# Patient Record
Sex: Female | Born: 1990 | Race: White | Hispanic: No | Marital: Single | Smoking: Current some day smoker
Health system: Southern US, Community
[De-identification: ages and names within clinical notes are randomized; demographics above are authoritative.]

## PROBLEM LIST (undated history)

## (undated) DIAGNOSIS — J189 Pneumonia, unspecified organism: Secondary | ICD-10-CM

## (undated) DIAGNOSIS — I10 Essential (primary) hypertension: Secondary | ICD-10-CM

## (undated) DIAGNOSIS — J45909 Unspecified asthma, uncomplicated: Secondary | ICD-10-CM

## (undated) DIAGNOSIS — D66 Hereditary factor VIII deficiency: Secondary | ICD-10-CM

## (undated) DIAGNOSIS — E162 Hypoglycemia, unspecified: Secondary | ICD-10-CM

---

## 2008-01-06 ENCOUNTER — Ambulatory Visit: Payer: Self-pay | Admitting: Family Medicine

## 2008-03-08 ENCOUNTER — Ambulatory Visit: Payer: Self-pay | Admitting: Family Medicine

## 2008-07-18 ENCOUNTER — Other Ambulatory Visit: Admission: RE | Admit: 2008-07-18 | Discharge: 2008-07-18 | Payer: Self-pay | Admitting: Family Medicine

## 2008-07-18 ENCOUNTER — Ambulatory Visit: Payer: Self-pay | Admitting: Family Medicine

## 2008-10-11 ENCOUNTER — Ambulatory Visit: Payer: Self-pay | Admitting: Family Medicine

## 2009-04-05 ENCOUNTER — Ambulatory Visit: Payer: Self-pay | Admitting: Family Medicine

## 2009-11-29 ENCOUNTER — Emergency Department (HOSPITAL_BASED_OUTPATIENT_CLINIC_OR_DEPARTMENT_OTHER): Admission: EM | Admit: 2009-11-29 | Discharge: 2009-11-29 | Payer: Self-pay | Admitting: Emergency Medicine

## 2009-11-29 ENCOUNTER — Ambulatory Visit: Payer: Self-pay | Admitting: Diagnostic Radiology

## 2012-04-01 ENCOUNTER — Encounter (HOSPITAL_COMMUNITY): Payer: Self-pay | Admitting: Emergency Medicine

## 2012-04-01 ENCOUNTER — Emergency Department (HOSPITAL_COMMUNITY)
Admission: EM | Admit: 2012-04-01 | Discharge: 2012-04-01 | Disposition: A | Discharge status: Home / Self Care | Payer: Federal, State, Local not specified - PPO | Attending: Emergency Medicine | Admitting: Emergency Medicine

## 2012-04-01 DIAGNOSIS — R6883 Chills (without fever): Secondary | ICD-10-CM | POA: Insufficient documentation

## 2012-04-01 DIAGNOSIS — L818 Other specified disorders of pigmentation: Secondary | ICD-10-CM

## 2012-04-01 DIAGNOSIS — J45909 Unspecified asthma, uncomplicated: Secondary | ICD-10-CM | POA: Insufficient documentation

## 2012-04-01 DIAGNOSIS — L02419 Cutaneous abscess of limb, unspecified: Secondary | ICD-10-CM | POA: Insufficient documentation

## 2012-04-01 DIAGNOSIS — L299 Pruritus, unspecified: Secondary | ICD-10-CM | POA: Insufficient documentation

## 2012-04-01 DIAGNOSIS — IMO0002 Reserved for concepts with insufficient information to code with codable children: Secondary | ICD-10-CM | POA: Insufficient documentation

## 2012-04-01 DIAGNOSIS — M79609 Pain in unspecified limb: Secondary | ICD-10-CM | POA: Insufficient documentation

## 2012-04-01 DIAGNOSIS — T4995XA Adverse effect of unspecified topical agent, initial encounter: Secondary | ICD-10-CM | POA: Insufficient documentation

## 2012-04-01 DIAGNOSIS — Z862 Personal history of diseases of the blood and blood-forming organs and certain disorders involving the immune mechanism: Secondary | ICD-10-CM | POA: Insufficient documentation

## 2012-04-01 DIAGNOSIS — R21 Rash and other nonspecific skin eruption: Secondary | ICD-10-CM | POA: Insufficient documentation

## 2012-04-01 DIAGNOSIS — Z8701 Personal history of pneumonia (recurrent): Secondary | ICD-10-CM | POA: Insufficient documentation

## 2012-04-01 DIAGNOSIS — L0390 Cellulitis, unspecified: Secondary | ICD-10-CM

## 2012-04-01 DIAGNOSIS — Z79899 Other long term (current) drug therapy: Secondary | ICD-10-CM | POA: Insufficient documentation

## 2012-04-01 DIAGNOSIS — I10 Essential (primary) hypertension: Secondary | ICD-10-CM | POA: Insufficient documentation

## 2012-04-01 DIAGNOSIS — L03119 Cellulitis of unspecified part of limb: Secondary | ICD-10-CM | POA: Insufficient documentation

## 2012-04-01 DIAGNOSIS — Z8639 Personal history of other endocrine, nutritional and metabolic disease: Secondary | ICD-10-CM | POA: Insufficient documentation

## 2012-04-01 HISTORY — DX: Essential (primary) hypertension: I10

## 2012-04-01 HISTORY — DX: Hereditary factor VIII deficiency: D66

## 2012-04-01 HISTORY — DX: Pneumonia, unspecified organism: J18.9

## 2012-04-01 HISTORY — DX: Unspecified asthma, uncomplicated: J45.909

## 2012-04-01 HISTORY — DX: Hypoglycemia, unspecified: E16.2

## 2012-04-01 MED ORDER — SULFAMETHOXAZOLE-TRIMETHOPRIM 800-160 MG PO TABS: 2.0000 | ORAL_TABLET | Freq: Two times a day (BID) | ORAL | Status: DC

## 2012-04-01 MED ORDER — NAPROXEN 500 MG PO TABS: 500.0000 mg | ORAL_TABLET | Freq: Two times a day (BID) | ORAL | Status: DC

## 2012-04-01 NOTE — ED Provider Notes (Signed)
History     CSN: [id number]  Arrival date & time 04/01/12  1818   First MD Initiated Contact with Patient 04/01/12 1834      No chief complaint on file.   (Consider location/radiation/quality/duration/timing/severity/associated sxs/prior treatment) HPI Comments: 22 year old female presents with CC "chunks of skin falling off", new redness, and pruritus at new tattoo site x 24 hours. Tattoo was originally done 5 days ago and redness and puritis has been worsening. It is on laterally aspect of right lower leg. Pt states her sister was treated at the ER yesterday for a "staph infection" at the tattoo site following their joint visit to the same tattoo parlor. She is now concerned she has a similar infection. Reports having chills x 24 hours. Pt tried vasoline on tattoo but sxs did not improve. Denies: SOB, cough, nausea, vomiting, numbness/tingling in extremities, swelling to the affected leg. Denies being a DM, HIV, steroid use or other immunocompromised hx.   The history is provided by the patient.    No past medical history on file.  No past surgical history on file.  No family history on file.  History  Substance Use Topics  . Smoking status: Not on file  . Smokeless tobacco: Not on file  . Alcohol Use: Not on file    OB History   No data available      Review of Systems  Constitutional: Positive for chills.  Respiratory: Negative for cough and shortness of breath.   Gastrointestinal: Negative for nausea and vomiting.  Skin: Positive for rash.  Neurological: Negative for numbness.  All other systems reviewed and are negative.    Allergies  Amoxicillin and Penicillins  Home Medications   Current Outpatient Rx  Name  Route  Sig  Dispense  Refill  . acetaminophen (TYLENOL) 500 MG tablet   Oral   Take 1,000 mg by mouth every 6 (six) hours as needed for pain.         Marland Kitchen albuterol (PROVENTIL HFA;VENTOLIN HFA) 108 (90 BASE) MCG/ACT inhaler   Inhalation   Inhale 2  puffs into the lungs every 6 (six) hours as needed for wheezing.         . fluticasone (FLONASE) 50 MCG/ACT nasal spray   Nasal   Place 2 sprays into the nose daily as needed for allergies.         Marland Kitchen ibuprofen (ADVIL,MOTRIN) 200 MG tablet   Oral   Take 800 mg by mouth every 8 (eight) hours as needed for pain.           There were no vitals taken for this visit.  Physical Exam  Nursing note and vitals reviewed. Constitutional: She is oriented to person, place, and time. She appears well-developed and well-nourished. No distress.  HENT:  Head: Normocephalic and atraumatic.  Eyes: Conjunctivae and EOM are normal.  Neck: Normal range of motion.  Cardiovascular: Normal rate, regular rhythm and normal heart sounds.   Pulmonary/Chest: Effort normal and breath sounds normal. She has no wheezes. She has no rales.  Musculoskeletal: Normal range of motion.  Neurological: She is alert and oriented to person, place, and time.  Skin: Skin is warm and dry. Rash noted. She is not diaphoretic.     Tattoo on lateral aspect of right lower leg appears erythematous around ink. No edema or excessive warmth appreciated. Tender to palpation.   Psychiatric: She has a normal mood and affect. Her behavior is normal.    ED Course  Procedures (  including critical care time)  Labs Reviewed - No data to display No results found.   No diagnosis found.    MDM  Possible cellulitic infection surrounding  Patient is a 23 year old female who reports worsening pain, erythema and pruritus site of tattoo of performed 5 days ago. Patient will be treated for MRSA infection and advised to return for wound check in 2 days or sooner if infection worsens. No concern for systemic infection as patient is afebrile with out streaking up leg, pain into joint or other concerning s/s. Strict return precautions discussed.         Jaci Carrel, [place] 04/01/12 1954

## 2012-04-01 NOTE — ED Provider Notes (Signed)
Medical screening examination/treatment/procedure(s) were performed by non-physician practitioner and as supervising physician I was immediately available for consultation/collaboration.    Celene Kras, MD 04/01/12 2003

## 2012-04-01 NOTE — ED Notes (Signed)
Patient received tatoo this past Saturday.  Noticed redness starting two days ago, drainage a yellow color.

## 2012-04-23 ENCOUNTER — Emergency Department (HOSPITAL_COMMUNITY)
Admission: EM | Admit: 2012-04-23 | Discharge: 2012-04-23 | Disposition: A | Discharge status: Home / Self Care | Payer: Federal, State, Local not specified - PPO | Attending: Emergency Medicine | Admitting: Emergency Medicine

## 2012-04-23 ENCOUNTER — Emergency Department (HOSPITAL_COMMUNITY): Payer: Federal, State, Local not specified - PPO

## 2012-04-23 ENCOUNTER — Encounter (HOSPITAL_COMMUNITY): Payer: Self-pay | Admitting: Emergency Medicine

## 2012-04-23 DIAGNOSIS — J45909 Unspecified asthma, uncomplicated: Secondary | ICD-10-CM | POA: Insufficient documentation

## 2012-04-23 DIAGNOSIS — Z862 Personal history of diseases of the blood and blood-forming organs and certain disorders involving the immune mechanism: Secondary | ICD-10-CM | POA: Insufficient documentation

## 2012-04-23 DIAGNOSIS — Z3202 Encounter for pregnancy test, result negative: Secondary | ICD-10-CM | POA: Insufficient documentation

## 2012-04-23 DIAGNOSIS — Z79899 Other long term (current) drug therapy: Secondary | ICD-10-CM | POA: Insufficient documentation

## 2012-04-23 DIAGNOSIS — I10 Essential (primary) hypertension: Secondary | ICD-10-CM | POA: Insufficient documentation

## 2012-04-23 DIAGNOSIS — Z8701 Personal history of pneumonia (recurrent): Secondary | ICD-10-CM | POA: Insufficient documentation

## 2012-04-23 DIAGNOSIS — R109 Unspecified abdominal pain: Secondary | ICD-10-CM

## 2012-04-23 LAB — URINALYSIS, ROUTINE W REFLEX MICROSCOPIC
Bilirubin Urine: NEGATIVE
Glucose, UA: NEGATIVE mg/dL
Hgb urine dipstick: NEGATIVE
Ketones, ur: NEGATIVE mg/dL
Nitrite: NEGATIVE
Protein, ur: NEGATIVE mg/dL
Specific Gravity, Urine: 1.02 (ref 1.005–1.030)
Urobilinogen, UA: 0.2 mg/dL (ref 0.0–1.0)
pH: 7.5 (ref 5.0–8.0)

## 2012-04-23 LAB — POCT I-STAT, CHEM 8
BUN: 13 mg/dL (ref 6–23)
Calcium, Ion: 1.18 mmol/L (ref 1.12–1.23)
Chloride: 103 mEq/L (ref 96–112)
Creatinine, Ser: 0.8 mg/dL (ref 0.50–1.10)
Glucose, Bld: 86 mg/dL (ref 70–99)
HCT: 38 % (ref 36.0–46.0)
Hemoglobin: 12.9 g/dL (ref 12.0–15.0)
Potassium: 4 mEq/L (ref 3.5–5.1)
Sodium: 140 mEq/L (ref 135–145)
TCO2: 28 mmol/L (ref 0–100)

## 2012-04-23 LAB — URINE MICROSCOPIC-ADD ON

## 2012-04-23 LAB — PREGNANCY, URINE: Preg Test, Ur: NEGATIVE

## 2012-04-23 NOTE — ED Notes (Signed)
Patient saw blood in stool twice today small amount.  No history of hemorrhoids.

## 2012-04-23 NOTE — ED Notes (Signed)
Patient c/o left flank pain since Monday.  Patient also reports bright red blood in stool.  Patient denies fevers or N/V/D.

## 2012-04-23 NOTE — ED Provider Notes (Signed)
History    22 year old female with left flank pain. Onset about 3 days ago. Patient first noticed that she was walking up some steps. He has been intermittent since then. Sharp. Radiates to her back. No urinary complaints. No unusual vaginal bleeding or discharge. No fevers or chills. No nausea or vomiting. No diarrhea. No history of similar pain. No significant surgical history.  CSN: [id number]  Arrival date & time 04/23/12  9562   First MD Initiated Contact with Patient 04/23/12 1845      Chief Complaint  Patient presents with  . Flank Pain    (Consider location/radiation/quality/duration/timing/severity/associated sxs/prior treatment) HPI  Past Medical History  Diagnosis Date  . Pneumonia   . Hypertension   . Asthma   . Hemophilia   . Hypoglycemia     History reviewed. No pertinent past surgical history.  History reviewed. No pertinent family history.  History  Substance Use Topics  . Smoking status: Not on file  . Smokeless tobacco: Not on file  . Alcohol Use: Not on file    OB History   Grav Para Term Preterm Abortions TAB SAB Ect Mult Living                  Review of Systems  Allergies  Amoxicillin and Penicillins  Home Medications   Current Outpatient Rx  Name  Route  Sig  Dispense  Refill  . albuterol (PROVENTIL HFA;VENTOLIN HFA) 108 (90 BASE) MCG/ACT inhaler   Inhalation   Inhale 2 puffs into the lungs every 6 (six) hours as needed for wheezing.         . fluticasone (FLONASE) 50 MCG/ACT nasal spray   Nasal   Place 2 sprays into the nose daily as needed for allergies.         Marland Kitchen ibuprofen (ADVIL,MOTRIN) 200 MG tablet   Oral   Take 800 mg by mouth every 8 (eight) hours as needed for pain.         . naproxen (NAPROSYN) 500 MG tablet   Oral   Take 1 tablet (500 mg total) by mouth 2 (two) times daily.   30 tablet   0     BP 132/93  Pulse 78  Temp(Src) 98 F (36.7 C) (Oral)  Resp 20  SpO2 100%  LMP 03/23/2012  Physical Exam   Nursing note and vitals reviewed. Constitutional: She appears well-developed and well-nourished. No distress.  HENT:  Head: Normocephalic and atraumatic.  Eyes: Conjunctivae are normal. Right eye exhibits no discharge. Left eye exhibits no discharge.  Neck: Neck supple.  Cardiovascular: Normal rate, regular rhythm and normal heart sounds.  Exam reveals no gallop and no friction rub.   No murmur heard. Pulmonary/Chest: Effort normal and breath sounds normal. No respiratory distress.  Abdominal: Soft. She exhibits no distension and no mass. There is tenderness. There is no rebound and no guarding.  Left-sided abdominal tenderness without rebound or guarding. No distention.  Genitourinary:  No costovertebral angle tenderness  Musculoskeletal: She exhibits no edema and no tenderness.  Neurological: She is alert.  Skin: Skin is warm and dry. She is not diaphoretic.  Psychiatric: She has a normal mood and affect. Her behavior is normal. Thought content normal.    ED Course  Procedures (including critical care time)  Labs Reviewed  URINALYSIS, ROUTINE W REFLEX MICROSCOPIC - Abnormal; Notable for the following:    APPearance CLOUDY (*)    Leukocytes, UA MODERATE (*)    All other components within normal limits  URINE MICROSCOPIC-ADD ON - Abnormal; Notable for the following:    Squamous Epithelial / LPF MANY (*)    All other components within normal limits  PREGNANCY, URINE  POCT I-STAT, CHEM 8   Ct Abdomen Pelvis Wo Contrast  04/23/2012  *RADIOLOGY REPORT*  Clinical Data: Left flank pain for 4 days, bright red blood in stool  CT ABDOMEN AND PELVIS WITHOUT CONTRAST  Technique:  Multidetector CT imaging of the abdomen and pelvis was performed following the standard protocol without intravenous contrast. Sagittal and coronal MPR images reconstructed from axial data set.  Comparison: None  Findings: Lung bases clear. No urinary tract calcification, hydronephrosis or ureteral dilatation.  Within limits of a nonenhanced exam no focal abnormalities of the liver, spleen, pancreas, kidneys, or adrenal glands identified. Normal appendix.  Unremarkable bladder, ureters, uterus and adnexae. Stomach and bowel loops grossly normal for technique. No mass, adenopathy, free fluid or inflammatory process. No hernia or acute bony lesion.  IMPRESSION: No acute intra-abdominal or intrapelvic abnormalities.   Original Report Authenticated By: Ulyses Southward, M.D.      1. Flank pain       MDM  22yf with L flank pain. W/u including CT a/p reassuring.  Possibly muscular strain? Low suspicion for emergent etiology. Symptomatic tx. Return precautions discussed. Outpt FU otherwise.         Raeford Razor, MD 04/25/12 [phone]

## 2012-07-17 ENCOUNTER — Emergency Department (HOSPITAL_COMMUNITY): Payer: Federal, State, Local not specified - PPO

## 2012-07-17 ENCOUNTER — Emergency Department (HOSPITAL_COMMUNITY)
Admission: EM | Admit: 2012-07-17 | Discharge: 2012-07-17 | Disposition: A | Discharge status: Home / Self Care | Payer: Federal, State, Local not specified - PPO | Attending: Emergency Medicine | Admitting: Emergency Medicine

## 2012-07-17 DIAGNOSIS — Z8639 Personal history of other endocrine, nutritional and metabolic disease: Secondary | ICD-10-CM | POA: Insufficient documentation

## 2012-07-17 DIAGNOSIS — S8990XA Unspecified injury of unspecified lower leg, initial encounter: Secondary | ICD-10-CM | POA: Insufficient documentation

## 2012-07-17 DIAGNOSIS — Z8701 Personal history of pneumonia (recurrent): Secondary | ICD-10-CM | POA: Insufficient documentation

## 2012-07-17 DIAGNOSIS — Z791 Long term (current) use of non-steroidal anti-inflammatories (NSAID): Secondary | ICD-10-CM | POA: Insufficient documentation

## 2012-07-17 DIAGNOSIS — Y9389 Activity, other specified: Secondary | ICD-10-CM | POA: Insufficient documentation

## 2012-07-17 DIAGNOSIS — Y9289 Other specified places as the place of occurrence of the external cause: Secondary | ICD-10-CM | POA: Insufficient documentation

## 2012-07-17 DIAGNOSIS — M25572 Pain in left ankle and joints of left foot: Secondary | ICD-10-CM

## 2012-07-17 DIAGNOSIS — S99929A Unspecified injury of unspecified foot, initial encounter: Secondary | ICD-10-CM | POA: Insufficient documentation

## 2012-07-17 DIAGNOSIS — Z79899 Other long term (current) drug therapy: Secondary | ICD-10-CM | POA: Insufficient documentation

## 2012-07-17 DIAGNOSIS — Z862 Personal history of diseases of the blood and blood-forming organs and certain disorders involving the immune mechanism: Secondary | ICD-10-CM | POA: Insufficient documentation

## 2012-07-17 DIAGNOSIS — S9000XA Contusion of unspecified ankle, initial encounter: Secondary | ICD-10-CM | POA: Insufficient documentation

## 2012-07-17 DIAGNOSIS — Z880 Allergy status to penicillin: Secondary | ICD-10-CM | POA: Insufficient documentation

## 2012-07-17 DIAGNOSIS — Y990 Civilian activity done for income or pay: Secondary | ICD-10-CM | POA: Insufficient documentation

## 2012-07-17 DIAGNOSIS — X500XXA Overexertion from strenuous movement or load, initial encounter: Secondary | ICD-10-CM | POA: Insufficient documentation

## 2012-07-17 DIAGNOSIS — J45909 Unspecified asthma, uncomplicated: Secondary | ICD-10-CM | POA: Insufficient documentation

## 2012-07-17 DIAGNOSIS — I10 Essential (primary) hypertension: Secondary | ICD-10-CM | POA: Insufficient documentation

## 2012-07-17 MED ORDER — IBUPROFEN 800 MG PO TABS
800.0000 mg | ORAL_TABLET | Freq: Once | ORAL | Status: AC
  Administered 2012-07-17: 800 mg via ORAL
  Filled 2012-07-17: qty 1

## 2012-07-17 NOTE — ED Provider Notes (Signed)
History    This chart was scribed for Debra Velez, non-physician practitioner working with Raeford Razor, MD by Leone Payor, ED Scribe. This patient was seen in room WTR9/WTR9 and the patient's care was started at 2145.   CSN: [id number]  Arrival date & time 07/17/12  2145   First MD Initiated Contact with Patient 07/17/12 2226      Chief Complaint  Patient presents with  . Ankle Injury     The history is provided by the patient. No language interpreter was used.    HPI Comments: Debra Velez is a 22 y.o. female who presents to the Emergency Department complaining of a L ankle injury starting this morning. States she rolled her ankle and now has swelling and pain to lateral side of L ankle. 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 When she got off work she noticed some swelling and bruising. She did not use any ice PTA.  Pt is able to bear weight and walk. Pt did drive herself here.    Past Medical History  Diagnosis Date  . Pneumonia   . Hypertension   . Asthma   . Hemophilia   . Hypoglycemia     No past surgical history on file.  No family history on file.  History  Substance Use Topics  . Smoking status: Not on file  . Smokeless tobacco: Not on file  . Alcohol Use: Not on file    OB History   Grav Para Term Preterm Abortions TAB SAB Ect Mult Living                  Review of Systems  Constitutional: Negative for fever and diaphoresis.  HENT: Negative for neck pain and neck stiffness.   Eyes: Negative for visual disturbance.  Respiratory: Negative for apnea, chest tightness and shortness of breath.   Cardiovascular: Negative for chest pain and palpitations.  Gastrointestinal: Negative for nausea, vomiting, diarrhea and constipation.  Musculoskeletal: Positive for joint swelling. Negative for gait problem.       Left lateral ankle  Skin: Negative for rash.   Neurological: Negative for dizziness, weakness, light-headedness, numbness and headaches.  Psychiatric/Behavioral: Negative for behavioral problems.    Allergies  Amoxicillin and Penicillins  Home Medications   Current Outpatient Rx  Name  Route  Sig  Dispense  Refill  . ibuprofen (ADVIL,MOTRIN) 200 MG tablet   Oral   Take 800 mg by mouth every 8 (eight) hours as needed for pain.         . naproxen (NAPROSYN) 500 MG tablet   Oral   Take 1 tablet (500 mg total) by mouth 2 (two) times daily.   30 tablet   0   . albuterol (PROVENTIL HFA;VENTOLIN HFA) 108 (90 BASE) MCG/ACT inhaler   Inhalation   Inhale 2 puffs into the lungs every 6 (six) hours as needed for wheezing.         . fluticasone (FLONASE) 50 MCG/ACT nasal spray   Nasal   Place 2 sprays into the nose daily as needed for allergies.           BP 131/86  Pulse 82  Temp(Src) 98.9 F (37.2 C) (Oral)  Resp 16  SpO2 100%  LMP 07/16/2012  Physical Exam  Nursing note and vitals reviewed. Constitutional: She is oriented to person, place, and time. She appears well-developed and well-nourished. No distress.  HENT:  Head: Normocephalic and atraumatic.  Eyes: Conjunctivae and EOM are normal.  Neck: Normal range of motion. Neck supple.  No meningeal signs  Cardiovascular: Normal rate, regular rhythm, normal heart sounds and intact distal pulses.   Good pedal pulses.   Pulmonary/Chest: Effort normal and breath sounds normal.  Abdominal: Soft.  Musculoskeletal: Normal range of motion. She exhibits no edema and no tenderness.  FROM to injured extremity. Able to wiggle toes. Good plantar and dorsiflexion.   Neurological: She is alert and oriented to person, place, and time. No cranial nerve deficit.  Speech is clear and goal oriented, follows commands Sensation normal to light touch Moves extremities without ataxia, coordination intact Normal gait and balance Normal strength in upper and lower extremities  bilaterally including dorsiflexion and plantar flexion, strong and equal grip strength   Skin: Skin is warm and dry. She is not diaphoretic. No erythema.  Psychiatric: She has a normal mood and affect.    ED Course  Procedures (including critical care time)  DIAGNOSTIC STUDIES: Oxygen Saturation is 100% on RA, normal by my interpretation.    COORDINATION OF CARE: 11:09 PM Discussed treatment plan with pt at bedside and pt agreed to plan.   Labs Reviewed - No data to display Dg Ankle Complete Left  07/17/2012   *RADIOLOGY REPORT*  Clinical Data: Twist injury today, lateral pain and swelling  LEFT ANKLE COMPLETE - 3+ VIEW  Comparison: None  Findings: Osseous mineralization normal. Joint spaces preserved. No acute fracture, dislocation or bone destruction.  IMPRESSION: No acute osseous abnormalities.   Original Report Authenticated By: Ulyses Southward, M.D.     1. Left lateral ankle pain       MDM  FROM. Neurovascularly intact. Imaging shows no fracture. Directed pt to ice injury, take acetaminophen or ibuprofen for pain, and to elevate and rest the injury when possible. Pt declined ace wrap or crutches.   I personally performed the services described in this documentation, which was scribed in my presence. The recorded information has been reviewed and is accurate.    Debra Nurse, PA-C 07/18/12 [phone]

## 2012-07-17 NOTE — ED Notes (Signed)
Pt states she rolled her L ankle at work this AM. Pt has some swelling to the lateral side of her L ankle. Pt ambulatory to exam room with steady gait. Pt states she drove herself here.

## 2012-07-24 NOTE — ED Provider Notes (Signed)
Medical screening examination/treatment/procedure(s) were performed by non-physician practitioner and as supervising physician I was immediately available for consultation/collaboration.  Deem Marmol, MD 07/24/12 1646 

## 2013-04-12 ENCOUNTER — Emergency Department (HOSPITAL_COMMUNITY)
Admission: EM | Admit: 2013-04-12 | Discharge: 2013-04-13 | Disposition: A | Discharge status: Home / Self Care | Payer: Federal, State, Local not specified - PPO | Attending: Emergency Medicine | Admitting: Emergency Medicine

## 2013-04-12 ENCOUNTER — Encounter (HOSPITAL_COMMUNITY): Payer: Self-pay | Admitting: Emergency Medicine

## 2013-04-12 DIAGNOSIS — Z862 Personal history of diseases of the blood and blood-forming organs and certain disorders involving the immune mechanism: Secondary | ICD-10-CM | POA: Insufficient documentation

## 2013-04-12 DIAGNOSIS — I10 Essential (primary) hypertension: Secondary | ICD-10-CM | POA: Insufficient documentation

## 2013-04-12 DIAGNOSIS — Z880 Allergy status to penicillin: Secondary | ICD-10-CM | POA: Insufficient documentation

## 2013-04-12 DIAGNOSIS — F17200 Nicotine dependence, unspecified, uncomplicated: Secondary | ICD-10-CM | POA: Insufficient documentation

## 2013-04-12 DIAGNOSIS — Z87828 Personal history of other (healed) physical injury and trauma: Secondary | ICD-10-CM | POA: Insufficient documentation

## 2013-04-12 DIAGNOSIS — Z8639 Personal history of other endocrine, nutritional and metabolic disease: Secondary | ICD-10-CM | POA: Insufficient documentation

## 2013-04-12 DIAGNOSIS — Z79899 Other long term (current) drug therapy: Secondary | ICD-10-CM | POA: Insufficient documentation

## 2013-04-12 DIAGNOSIS — J45909 Unspecified asthma, uncomplicated: Secondary | ICD-10-CM | POA: Insufficient documentation

## 2013-04-12 DIAGNOSIS — Z8701 Personal history of pneumonia (recurrent): Secondary | ICD-10-CM | POA: Insufficient documentation

## 2013-04-12 DIAGNOSIS — K089 Disorder of teeth and supporting structures, unspecified: Secondary | ICD-10-CM | POA: Insufficient documentation

## 2013-04-12 DIAGNOSIS — K0889 Other specified disorders of teeth and supporting structures: Secondary | ICD-10-CM

## 2013-04-12 NOTE — ED Notes (Signed)
Per pt report: pt c/o of tooth pain of her back upper tooth for the past 3 weeks.  Pt has been using oralgel but the medication is no longer effective.  Pt a/o x 4.  Skin warm and dry. NAD noted.

## 2013-04-12 NOTE — ED Notes (Signed)
Pt ambulatory to exam room with steady gait.  

## 2013-04-13 MED ORDER — TRAMADOL HCL 50 MG PO TABS: 50.0000 mg | ORAL_TABLET | Freq: Four times a day (QID) | ORAL | Status: AC | PRN

## 2013-04-13 MED ORDER — CLINDAMYCIN HCL 300 MG PO CAPS: 300.0000 mg | ORAL_CAPSULE | Freq: Three times a day (TID) | ORAL | Status: AC

## 2013-04-13 NOTE — Discharge Instructions (Signed)
Take antibiotic as prescribed. Take ultram as prescribed for severe pain.   Do not drive within four hours of taking this medication (may cause drowsiness or confusion).  Follow up with your dentist as soon as possible.  You should return to the ER if you develop worsening symptoms or facial swelling.

## 2013-04-13 NOTE — ED Provider Notes (Signed)
CSN: [id number]     Arrival date & time 04/12/13  2142 History   First MD Initiated Contact with Patient 04/13/13 0037     Chief Complaint  Patient presents with  . Dental Pain     (Consider location/radiation/quality/duration/timing/severity/associated sxs/prior Treatment) HPI History provided by pt.   Pt presents w/ right upper and lower posterior dental pain x 3 weeks.  Broke a right upper molar several months ago.  Associated w/ mild edema and tenderness of right cheek.  Denies fever.  No longer having relief w/ ibuprofen and orajel.  Has a dentist.    Past Medical History  Diagnosis Date  . Pneumonia   . Hypertension   . Asthma   . Hemophilia   . Hypoglycemia    History reviewed. No pertinent past surgical history. No family history on file. History  Substance Use Topics  . Smoking status: Current Some Day Smoker  . Smokeless tobacco: Not on file  . Alcohol Use: Yes     Comment: occasionally   OB History   Grav Para Term Preterm Abortions TAB SAB Ect Mult Living                 Review of Systems  All other systems reviewed and are negative.      Allergies  Amoxicillin and Penicillins  Home Medications   Current Outpatient Rx  Name  Route  Sig  Dispense  Refill  . Benzocaine (ORAL GEL ANESTHETIC MT)   Mouth/Throat   Use as directed 1 application in the mouth or throat 4 (four) times daily as needed (paon).         Marland Kitchen etonogestrel (NEXPLANON) 68 MG IMPL implant   Subcutaneous   Inject 1 each into the skin once.         . hydrocortisone 2.5 % cream   Topical   Apply 1 application topically 2 (two) times daily as needed (rash).         Marland Kitchen ibuprofen (ADVIL,MOTRIN) 200 MG tablet   Oral   Take 400 mg by mouth every 8 (eight) hours as needed for moderate pain.          Marland Kitchen albuterol (PROVENTIL HFA;VENTOLIN HFA) 108 (90 BASE) MCG/ACT inhaler   Inhalation   Inhale 2 puffs into the lungs every 6 (six) hours as needed for wheezing.         .  clindamycin (CLEOCIN) 300 MG capsule   Oral   Take 1 capsule (300 mg total) by mouth 3 (three) times daily.   21 capsule   0   . traMADol (ULTRAM) 50 MG tablet   Oral   Take 1 tablet (50 mg total) by mouth every 6 (six) hours as needed.   20 tablet   0    BP 139/90  Pulse 90  Temp(Src) 98.2 F (36.8 C) (Oral)  Resp 16  Wt 156 lb (70.761 kg)  SpO2 100%  LMP 03/29/2013 Physical Exam  Nursing note and vitals reviewed. Constitutional: She is oriented to person, place, and time. She appears well-developed and well-nourished. No distress.  HENT:  Head: Normocephalic and atraumatic.  2nd right upper molar fractured and mildly ttp.  Right lower 1st and 2nd molars w/ advanced caries.  No edema of adjacent gingiva or buccal mucosa.  Mild tenderness right maxilla and mandible.  No submental edema/induration.  No trismus.  Eyes:  Normal appearance  Neck: Normal range of motion.  Pulmonary/Chest: Effort normal.  Musculoskeletal: Normal range of motion.  Lymphadenopathy:    She has no cervical adenopathy.  Neurological: She is alert and oriented to person, place, and time.  Psychiatric: She has a normal mood and affect. Her behavior is normal.    ED Course  Procedures (including critical care time) Labs Review Labs Reviewed - No data to display Imaging Review No results found.   EKG Interpretation None      MDM   Final diagnoses:  Toothache    23yo F presents w/ dental pain.  Sensitivity to tempeture extremes.  Prescribed clindamycin and ultram.  She can not take NSAIDs (V[place] W[place]brand).  She has a Education officer, communitydentist.  Return precautions discussed. 12:58 AM     Otilio Miuatherine E Honor Frison, PA-C 04/13/13 [phone]

## 2013-04-13 NOTE — ED Provider Notes (Signed)
Medical screening examination/treatment/procedure(s) were performed by non-physician practitioner and as supervising physician I was immediately available for consultation/collaboration.   Sayge Salvato, MD 04/13/13 0532 

## 2014-03-30 IMAGING — CT CT ABD-PELV W/O CM
1 series · 15 of 21 positions shown, 20 images · non-contrast
Comparison: None

CLINICAL DATA: Left flank pain for 4 days, bright red blood in
stool

CT ABDOMEN AND PELVIS WITHOUT CONTRAST
TECHNIQUE: Multidetector CT imaging of the abdomen and pelvis was
performed following the standard protocol without intravenous
contrast. Sagittal and coronal MPR images reconstructed from axial
data set.

[Series 4: lung · axial · 0.74mm/px · z∈[+1096,+1186]mm · 15 of 21 slices shown, 20 images]
[im 2/21  soft-tissue]
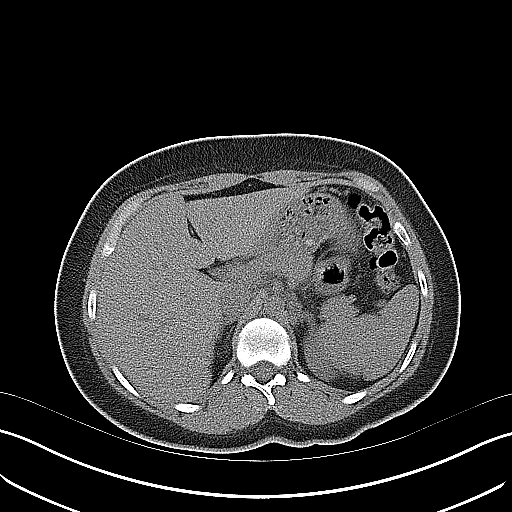
[im 2/21  bone]
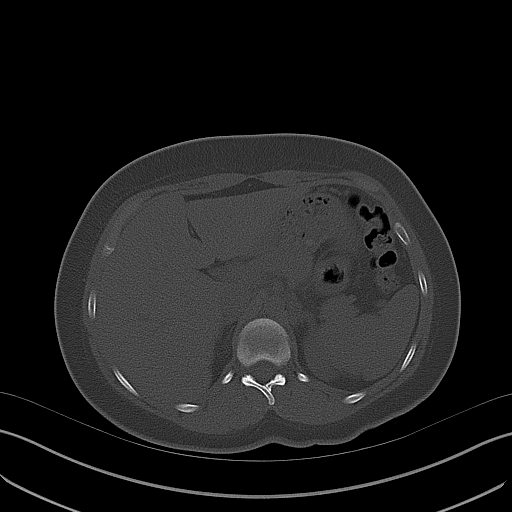
[im 4/21  soft-tissue]
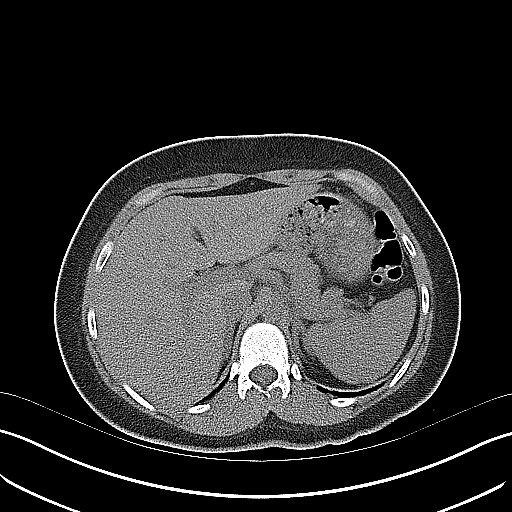
[im 5/21  soft-tissue]
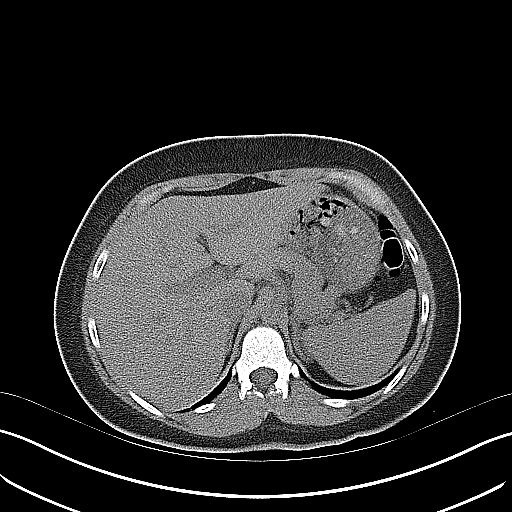
[im 6/21  soft-tissue]
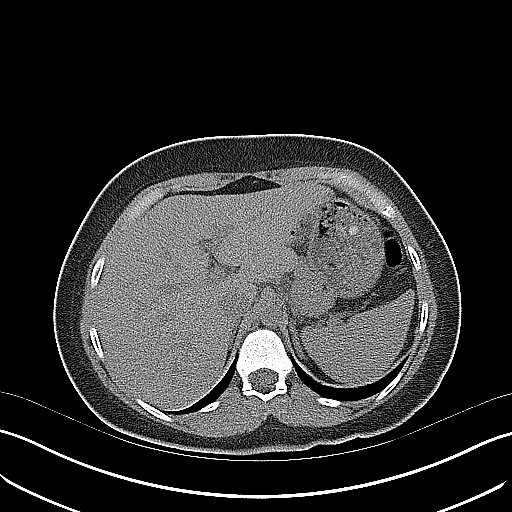
[im 8/21  soft-tissue]
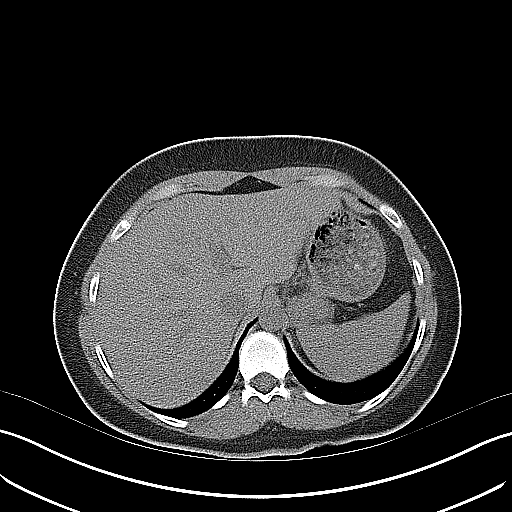
[im 9/21  soft-tissue]
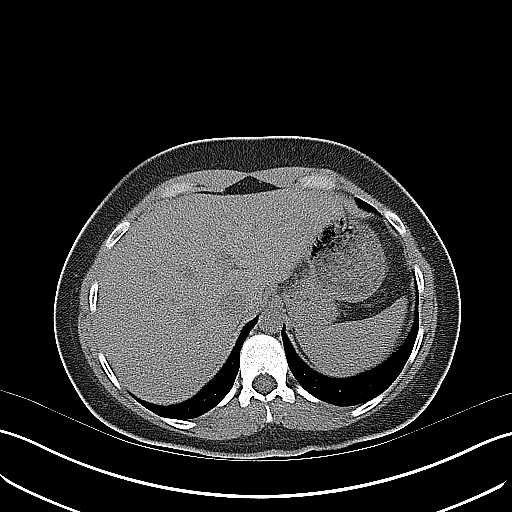
[im 10/21  soft-tissue]
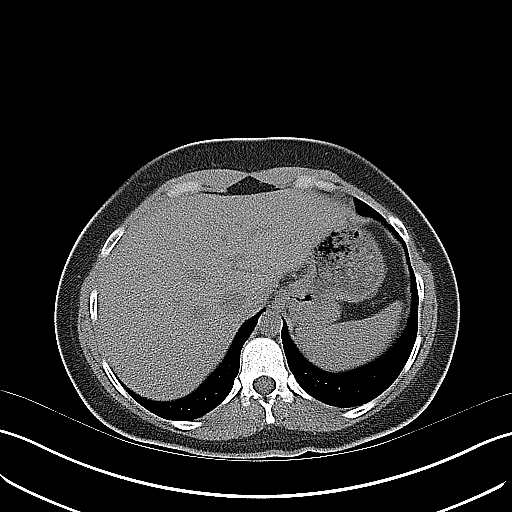
[im 12/21  soft-tissue]
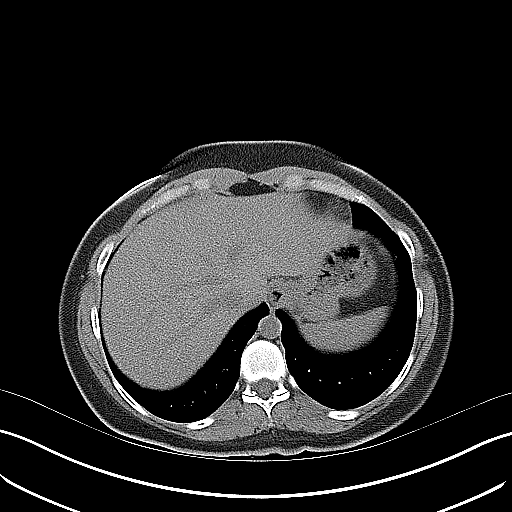
[im 13/21  soft-tissue]
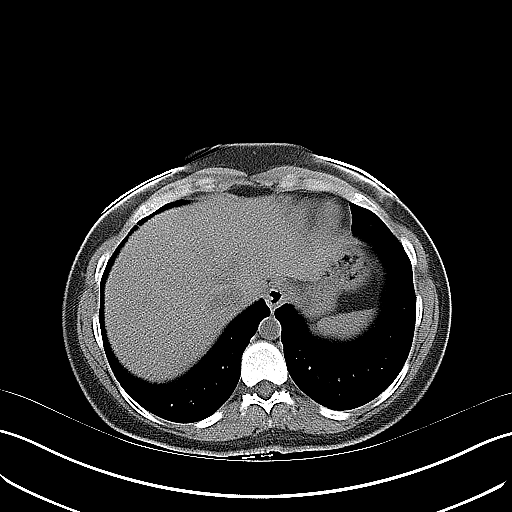
[im 13/21  bone]
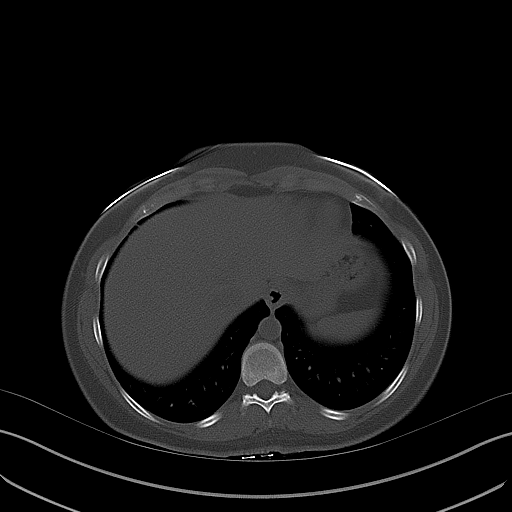
[im 14/21  soft-tissue]
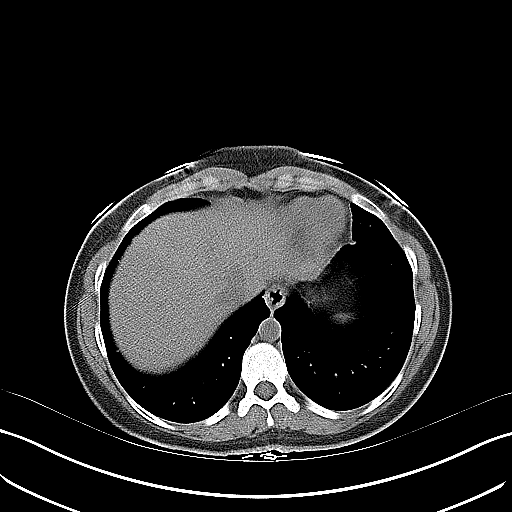
[im 16/21  soft-tissue]
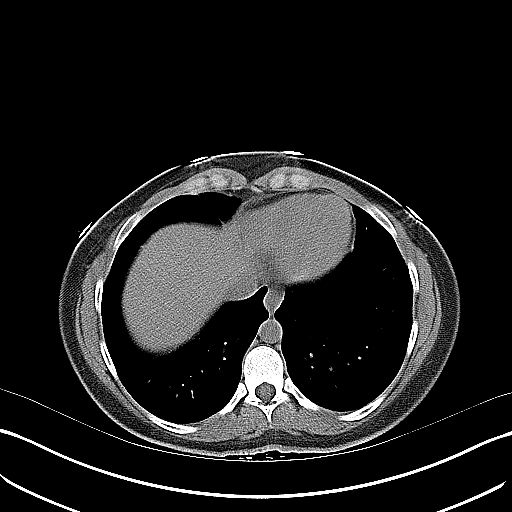
[im 17/21  soft-tissue]
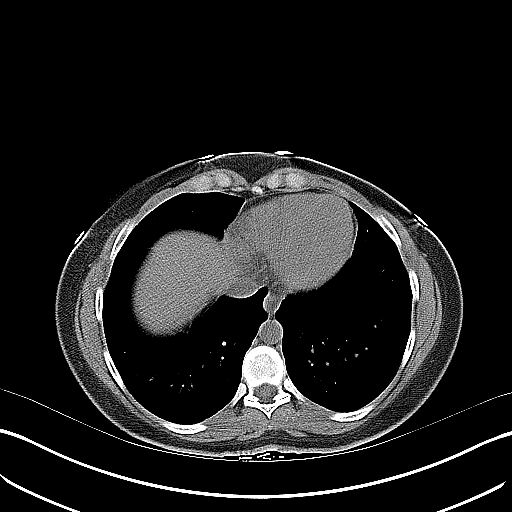
[im 17/21  lung]
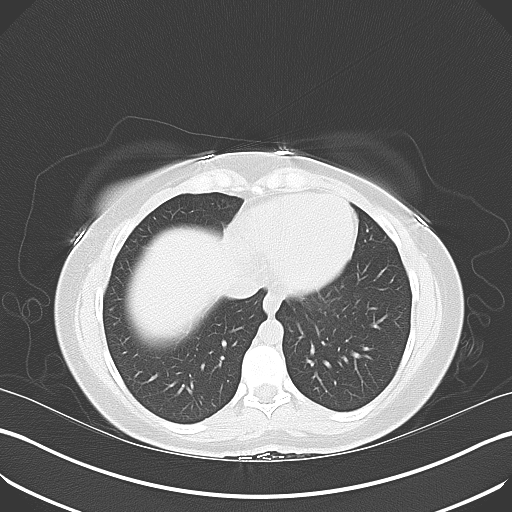
[im 18/21  soft-tissue]
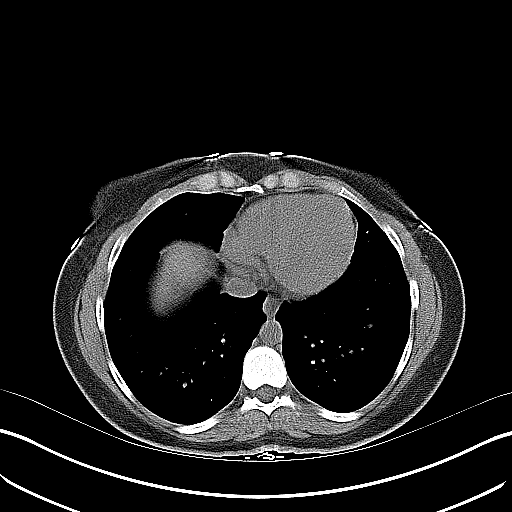
[im 18/21  lung]
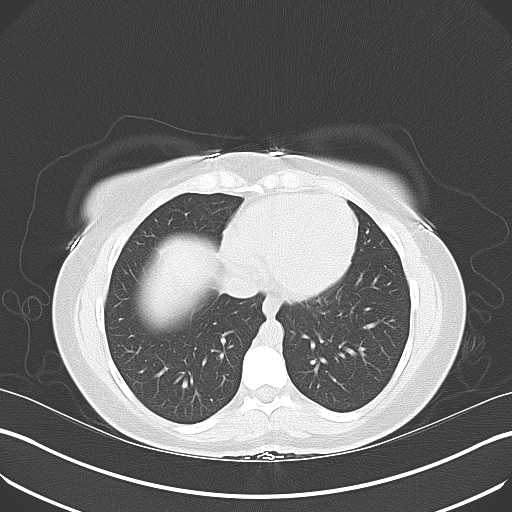
[im 19/21  lung]
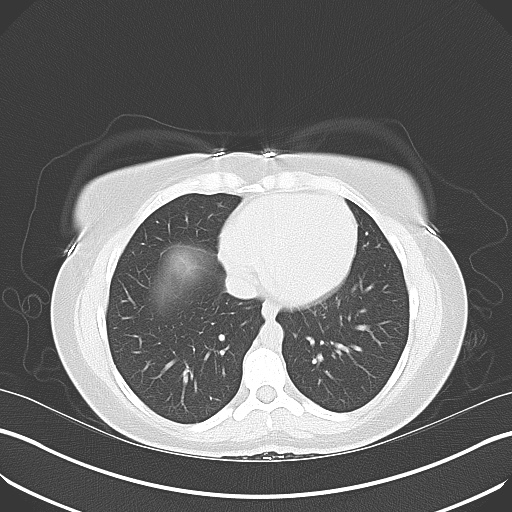
[im 20/21  soft-tissue]
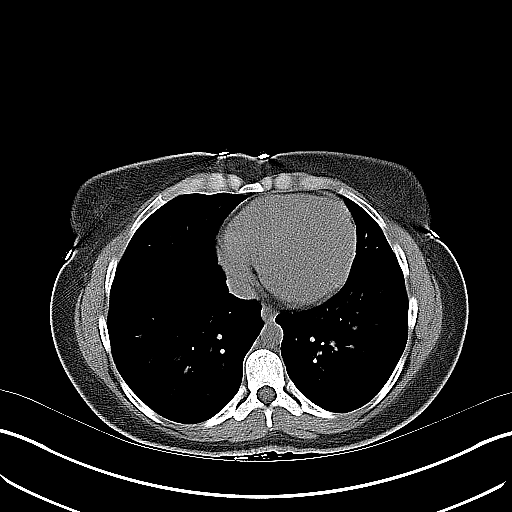
[im 20/21  lung]
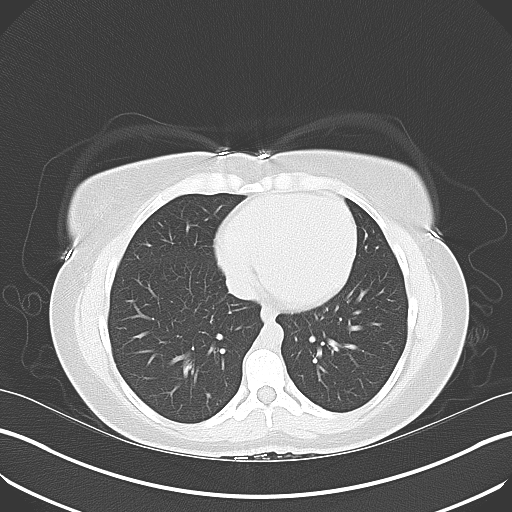

[15 of 21 positions shown; findings below may reference images not displayed]

FINDINGS: Lung bases clear.
No urinary tract calcification, hydronephrosis or ureteral
dilatation.
Within limits of a nonenhanced exam no focal abnormalities of the
liver, spleen, pancreas, kidneys, or adrenal glands identified.
Normal appendix.

Unremarkable bladder, ureters, uterus and adnexae.
Stomach and bowel loops grossly normal for technique.
No mass, adenopathy, free fluid or inflammatory process.
No hernia or acute bony lesion.
IMPRESSION: No acute intra-abdominal or intrapelvic abnormalities.

## 2017-03-25 ENCOUNTER — Encounter (HOSPITAL_COMMUNITY): Payer: Self-pay | Admitting: Emergency Medicine

## 2017-03-25 ENCOUNTER — Emergency Department (HOSPITAL_COMMUNITY): Payer: Self-pay

## 2017-03-25 ENCOUNTER — Emergency Department (HOSPITAL_COMMUNITY)
Admission: EM | Admit: 2017-03-25 | Discharge: 2017-03-26 | Disposition: A | Discharge status: Home / Self Care | Payer: Self-pay | Attending: Emergency Medicine | Admitting: Emergency Medicine

## 2017-03-25 DIAGNOSIS — F17200 Nicotine dependence, unspecified, uncomplicated: Secondary | ICD-10-CM | POA: Insufficient documentation

## 2017-03-25 DIAGNOSIS — R300 Dysuria: Secondary | ICD-10-CM | POA: Insufficient documentation

## 2017-03-25 DIAGNOSIS — R110 Nausea: Secondary | ICD-10-CM | POA: Insufficient documentation

## 2017-03-25 DIAGNOSIS — J45909 Unspecified asthma, uncomplicated: Secondary | ICD-10-CM | POA: Insufficient documentation

## 2017-03-25 DIAGNOSIS — Z79899 Other long term (current) drug therapy: Secondary | ICD-10-CM | POA: Insufficient documentation

## 2017-03-25 DIAGNOSIS — N760 Acute vaginitis: Secondary | ICD-10-CM | POA: Insufficient documentation

## 2017-03-25 DIAGNOSIS — B9689 Other specified bacterial agents as the cause of diseases classified elsewhere: Secondary | ICD-10-CM | POA: Insufficient documentation

## 2017-03-25 DIAGNOSIS — N898 Other specified noninflammatory disorders of vagina: Secondary | ICD-10-CM | POA: Insufficient documentation

## 2017-03-25 DIAGNOSIS — I10 Essential (primary) hypertension: Secondary | ICD-10-CM | POA: Insufficient documentation

## 2017-03-25 DIAGNOSIS — R05 Cough: Secondary | ICD-10-CM | POA: Insufficient documentation

## 2017-03-25 LAB — URINALYSIS, ROUTINE W REFLEX MICROSCOPIC
BILIRUBIN URINE: NEGATIVE
Glucose, UA: NEGATIVE mg/dL
Hgb urine dipstick: NEGATIVE
Ketones, ur: NEGATIVE mg/dL
Nitrite: NEGATIVE
Protein, ur: NEGATIVE mg/dL
SPECIFIC GRAVITY, URINE: 1.009 (ref 1.005–1.030)
pH: 6 (ref 5.0–8.0)

## 2017-03-25 LAB — BASIC METABOLIC PANEL
Anion gap: 9 (ref 5–15)
BUN: 13 mg/dL (ref 6–20)
CHLORIDE: 102 mmol/L (ref 101–111)
CO2: 25 mmol/L (ref 22–32)
CREATININE: 0.97 mg/dL (ref 0.44–1.00)
Calcium: 9.2 mg/dL (ref 8.9–10.3)
GFR calc non Af Amer: >60 mL/min (ref >60)
Glucose, Bld: 83 mg/dL (ref 65–99)
POTASSIUM: 4 mmol/L (ref 3.5–5.1)
SODIUM: 136 mmol/L (ref 135–145)

## 2017-03-25 LAB — CBC
HCT: 39.9 % (ref 36.0–46.0)
Hemoglobin: 13.7 g/dL (ref 12.0–15.0)
MCH: 31.1 pg (ref 26.0–34.0)
MCHC: 34.3 g/dL (ref 30.0–36.0)
MCV: 90.5 fL (ref 78.0–100.0)
PLATELETS: 447 10*3/uL — AB (ref 150–400)
RBC: 4.41 MIL/uL (ref 3.87–5.11)
RDW: 12.6 % (ref 11.5–15.5)
WBC: 7.9 10*3/uL (ref 4.0–10.5)

## 2017-03-25 LAB — RAPID HIV SCREEN (HIV 1/2 AB+AG)
HIV 1/2 ANTIBODIES: NONREACTIVE
HIV-1 P24 Antigen - HIV24: NONREACTIVE

## 2017-03-25 LAB — POC URINE PREG, ED: Preg Test, Ur: NEGATIVE

## 2017-03-25 LAB — WET PREP, GENITAL
Sperm: NONE SEEN
Trich, Wet Prep: NONE SEEN
Yeast Wet Prep HPF POC: NONE SEEN

## 2017-03-25 MED ORDER — DICYCLOMINE HCL 10 MG PO CAPS
10.0000 mg | ORAL_CAPSULE | Freq: Once | ORAL | Status: AC
  Administered 2017-03-25: 10 mg via ORAL
  Filled 2017-03-25: qty 1

## 2017-03-25 NOTE — ED Provider Notes (Signed)
[facility]-EMERGENCY DEPT Provider Note   CSN: [id number] Arrival date & time: 03/25/17  2004     History   Chief Complaint Chief Complaint  Patient presents with  . Flank Pain    HPI Debra Velez is a 27 y.o. female.  HPI   Debra Velez is a 27 year old female with a history of hemophilia, hypertension and asthma who presents to the emergency department for evaluation of generalized abdominal pain, dysuria, vaginal discharge and cough.  Patient states that Debra Velez has had generalized abdominal pain for the past week now, but seems to be worse today.  States that her pain feels "like squeezing" and is worsened when Debra Velez is on her feet for long periods.  States that it is improved with laying flat.  Reports her pain to be 3/10 in severity at this time.  Pain occasionally radiates to the bilateral lower back.  Furthermore, her abdomen feels "bloated." Debra Velez tried taking an antacid without improvement in pain. States that Debra Velez also has had a burning sensation with urination and thick white malodorous vaginal discharge.  States that Debra Velez has felt nauseated, but has not had any vomiting.  Debra Velez had one bowel movement today which was somewhat loose.  Debra Velez also states that Debra Velez has developed a dry cough, congestion and sore throat today with feeling tactile fevers.  Debra Velez has anterior chest wall pain with cough only, denies chest pain outside of cough.  Denies shortness of breath, headache, visual disturbance, numbness, weakness, hematochezia, melena, syncope.  Debra Velez uses Nexplanon for birth control.  States that Debra Velez was sexually active with one female partner about a month ago, denies regular condom use. Debra Velez does endorse being anxious, states that her heart beats fast at times with this.   Past Medical History:  Diagnosis Date  . Asthma   . Hemophilia (HCC)   . Hypertension   . Hypoglycemia   . Pneumonia     There are no active problems to display for this patient.   History  reviewed. No pertinent surgical history.  OB History    No data available       Home Medications    Prior to Admission medications   Medication Sig Start Date End Date Taking? Authorizing Provider  etonogestrel (NEXPLANON) 68 MG IMPL implant Inject 1 each into the skin once. 09/15/12  Yes [provider]  ibuprofen (ADVIL,MOTRIN) 200 MG tablet Take 400 mg by mouth every 8 (eight) hours as needed for moderate pain.    Yes [provider]  metoprolol tartrate (LOPRESSOR) 50 MG tablet Take 50 mg by mouth 2 (two) times daily.   Yes [provider]  omeprazole (PRILOSEC OTC) 20 MG tablet Take 20 mg by mouth daily.   Yes [provider]  clindamycin (CLEOCIN) 300 MG capsule Take 1 capsule (300 mg total) by mouth 3 (three) times daily. Patient not taking: Reported on 03/25/2017 04/13/13   Schinlever, Santina Evansatherine, PA-C  traMADol (ULTRAM) 50 MG tablet Take 1 tablet (50 mg total) by mouth every 6 (six) hours as needed. Patient not taking: Reported on 03/25/2017 04/13/13   Schinlever, Santina Evansatherine, PA-C    Family History No family history on file.  Social History Social History   Tobacco Use  . Smoking status: Current Some Day Smoker  Substance Use Topics  . Alcohol use: Yes    Comment: occasionally  . Drug use: No     Allergies   Amoxicillin and Penicillins   Review of Systems Review of Systems  Constitutional: Positive for fever (tactile). Negative for chills.  HENT: Positive for congestion and sore throat.   Eyes: Negative for visual disturbance.  Respiratory: Positive for cough. Negative for shortness of breath.   Cardiovascular: Positive for chest pain (with cough ).  Gastrointestinal: Positive for abdominal distention, abdominal pain, diarrhea and nausea. Negative for blood in stool and vomiting.  Genitourinary: Positive for dysuria. Negative for difficulty urinating, flank pain, frequency and hematuria.  Musculoskeletal: Positive for back pain  (bilateral lower back).  Skin: Negative for rash.  Neurological: Negative for weakness, numbness and headaches.     Physical Exam Updated Vital Signs BP 116/80 (BP Location: Right Arm)   Pulse (!) 115   Temp 99.2 F (37.3 C) (Oral)   Resp 18   LMP 02/10/2017   SpO2 99%   Physical Exam  Constitutional: Debra Velez is oriented to person, place, and time. Debra Velez appears well-developed and well-nourished. No distress.  Sitting at bedside in no apparent distress.  HENT:  Head: Normocephalic and atraumatic.  Mouth/Throat: Oropharynx is clear and moist. No oropharyngeal exudate.  Eyes: Conjunctivae are normal. Pupils are equal, round, and reactive to light. Right eye exhibits no discharge. Left eye exhibits no discharge.  Neck: Normal range of motion. Neck supple. No JVD present. No tracheal deviation present.  Cardiovascular: Normal rate, regular rhythm and intact distal pulses. Exam reveals no friction rub.  No murmur heard. Pulmonary/Chest: Effort normal and breath sounds normal. No stridor. No respiratory distress. Debra Velez has no wheezes. Debra Velez has no rales.  Abdominal: Soft. Bowel sounds are normal.  Abdomen soft.  Nontender to palpation.  No guarding or rigidity.  No CVA tenderness.  Negative Murphy's sign.  Negative McBurney's point.  Genitourinary:  Genitourinary Comments: Chaperone present for exam. Yellow, thick discharge in the vaginal vault. No CMT. No adnexal masses, tenderness, or fullness.  No bleeding within vaginal vault.   Musculoskeletal: Normal range of motion.  Neurological: Debra Velez is alert and oriented to person, place, and time. Coordination normal.  Skin: Skin is warm and dry. Capillary refill takes less than 2 seconds. Debra Velez is not diaphoretic.  Psychiatric: Debra Velez has a normal mood and affect. Her behavior is normal.  Nursing note and vitals reviewed.    ED Treatments / Results  Labs (all labs ordered are listed, but only abnormal results are displayed) Labs Reviewed  WET PREP,  GENITAL - Abnormal; Notable for the following components:      Result Value   Clue Cells Wet Prep HPF POC PRESENT (*)    WBC, Wet Prep HPF POC MANY (*)    All other components within normal limits  URINALYSIS, ROUTINE W REFLEX MICROSCOPIC - Abnormal; Notable for the following components:   Leukocytes, UA SMALL (*)    Bacteria, UA MANY (*)    Squamous Epithelial / LPF 0-5 (*)    All other components within normal limits  CBC - Abnormal; Notable for the following components:   Platelets 447 (*)    All other components within normal limits  URINE CULTURE  BASIC METABOLIC PANEL  RAPID HIV SCREEN (HIV 1/2 AB+AG)  RPR  POC URINE PREG, ED  GC/CHLAMYDIA PROBE AMP () NOT AT [facility]    EKG  EKG Interpretation None       Radiology Dg Chest 2 View  Result Date: 03/25/2017 CLINICAL DATA:  Cough, fever EXAM: CHEST  2 VIEW COMPARISON:  None. FINDINGS: Heart and mediastinal contours are within normal limits. No focal opacities or effusions. No acute bony  abnormality. IMPRESSION: No active cardiopulmonary disease. Electronically Signed   By: Charlett Nose M.D.   On: 03/25/2017 22:30    Procedures Procedures (including critical care time)  Medications Ordered in ED Medications  sterile water (preservative free) injection (not administered)  dicyclomine (BENTYL) capsule 10 mg (10 mg Oral Given 03/25/17 2239)  cefTRIAXone (ROCEPHIN) injection 250 mg (250 mg Intramuscular Given 03/26/17 0032)  azithromycin (ZITHROMAX) tablet 1,000 mg (1,000 mg Oral Given 03/26/17 0032)     Initial Impression / Assessment and Plan / ED Course  I have reviewed the triage vital signs and the nursing notes.  Pertinent labs & imaging results that were available during my care of the patient were reviewed by me and considered in my medical decision making (see chart for details).  Clinical Course as of Mar 27 23  Thu Mar 26, 2017  0024 Pulse 106bpm on recheck  [ES]    Clinical Course User  Index [ES] Kellie Shropshire, PA-C    Presents with generalized abdominal pain, dysuria, malodorous vaginal discharge. Debra Velez also endorses nonproductive cough, congestion and sore throat beginning today. On exam Debra Velez is afebrile and non-toxic appearing. No acute distress. Debra Velez is tachycardic at 115bpm. Tolerating po fluids at the bedside. Abdomen soft and without tenderness. Significant amount of yellow discharge in the vaginal vault.  Labs reviewed. BMP and CBC unremarkable. UA not infected, appears contaminated. Urine pregnancy negative. Wet prep with clue cells and many WBCs. HIV rapid screen negative. CXR without acute abnormality.   Will treat patient's bacterial vaginosis with flagyl.  Counseled her not to drink alcohol while taking this medication.  Engaged in shared decision making with patient who would like to be prophylacticly treated for GC/Chlamydia given many WBCs on wet prep and yellow discharge on exam. Given rocephin and azithromycin in the Emergency department.  Debra Velez has been counseled that Debra Velez has GC/chlamydia testing which is pending and will need to inform all sexual partners if this is positive. No concern for PID given no CMT or adnexal tenderness.   Patient was given bentyl in the emergency department for her pain and Debra Velez reports that her symptoms have resolved on recheck. Pulse improved to 106, suspect her tachycardia on arrival was related to anxiety. Abdomen soft and without tenderness on repeat exam.   Will discharge with bentyl. Given exam and presentation, no concern for appendicitis, cholecystitis, perforated viscus, bowel obstruction, nephrolithiasis.  Her cough, congestion and sore throat are consistent with viral URI.  Chest x-ray non-concerning for pneumonia or acute abnormality.  Patient breathing comfortably on room air.  Have counseled her on symptomatic management.  Discussed return precautions the patient agrees and voiced understanding to the above plan.  Debra Velez has no  complaints prior to discharge.   Final Clinical Impressions(s) / ED Diagnoses   Final diagnoses:  Bacterial vaginosis    ED Discharge Orders        Ordered    metroNIDAZOLE (FLAGYL) 500 MG tablet  2 times daily     03/26/17 0031    dicyclomine (BENTYL) 20 MG tablet  2 times daily     03/26/17 0031       Kellie Shropshire, PA-C 03/26/17 0141    Alvira Monday, MD 03/26/17 1719

## 2017-03-25 NOTE — ED Triage Notes (Signed)
Patient presents with multiple complaints. Having bilateral lower back pain radiating into lower abdomen with burning and foul odor. Pt adds coughing began couple hours ago.

## 2017-03-25 NOTE — ED Notes (Signed)
I attempted to collect labs twice and was unsuccessful 

## 2017-03-26 LAB — RPR: RPR Ser Ql: NONREACTIVE

## 2017-03-26 MED ORDER — CEFTRIAXONE SODIUM 250 MG IJ SOLR
250.0000 mg | Freq: Once | INTRAMUSCULAR | Status: AC
  Administered 2017-03-26: 250 mg via INTRAMUSCULAR
  Filled 2017-03-26: qty 250

## 2017-03-26 MED ORDER — METRONIDAZOLE 500 MG PO TABS: 500.0000 mg | ORAL_TABLET | Freq: Two times a day (BID) | ORAL | 0 refills | Status: AC

## 2017-03-26 MED ORDER — STERILE WATER FOR INJECTION IJ SOLN
INTRAMUSCULAR | Status: AC
  Filled 2017-03-26: qty 10

## 2017-03-26 MED ORDER — DICYCLOMINE HCL 20 MG PO TABS: 20.0000 mg | ORAL_TABLET | Freq: Two times a day (BID) | ORAL | 0 refills | Status: AC

## 2017-03-26 MED ORDER — AZITHROMYCIN 250 MG PO TABS
1000.0000 mg | ORAL_TABLET | Freq: Once | ORAL | Status: AC
  Administered 2017-03-26: 1000 mg via ORAL
  Filled 2017-03-26: qty 4

## 2017-03-26 NOTE — Discharge Instructions (Signed)
Chest xray was reassuring.   Please take antibiotic flagyl for the next week to treat bacterial vaginosis. Do not drink alcohol while taking it.   You were treated for chlamydia and gonorrhea in the emergency department today.  You will receive a phone call in the next 3 days if testing for chlamydia and gonorrhea is positive.  If positive, you will need to inform all sexual partners.  Please take Bentyl as needed for abdominal spasms.  Return to the emergency department if you have fever greater than 100.4 F, your abdominal pain worsens, you have vomiting that does not stop, have trouble breathing or have any new or worsening symptoms.

## 2017-03-27 LAB — URINE CULTURE

## 2017-03-27 LAB — GC/CHLAMYDIA PROBE AMP (~~LOC~~) NOT AT ARMC
Chlamydia: NEGATIVE
NEISSERIA GONORRHEA: NEGATIVE

## 2017-07-31 ENCOUNTER — Encounter (HOSPITAL_COMMUNITY): Payer: Self-pay | Admitting: Emergency Medicine

## 2017-07-31 ENCOUNTER — Emergency Department (HOSPITAL_COMMUNITY)
Admission: EM | Admit: 2017-07-31 | Discharge: 2017-07-31 | Disposition: A | Discharge status: Home / Self Care | Payer: Federal, State, Local not specified - PPO | Attending: Emergency Medicine | Admitting: Emergency Medicine

## 2017-07-31 DIAGNOSIS — F17290 Nicotine dependence, other tobacco product, uncomplicated: Secondary | ICD-10-CM | POA: Insufficient documentation

## 2017-07-31 DIAGNOSIS — L299 Pruritus, unspecified: Secondary | ICD-10-CM | POA: Insufficient documentation

## 2017-07-31 DIAGNOSIS — Z789 Other specified health status: Secondary | ICD-10-CM

## 2017-07-31 DIAGNOSIS — L509 Urticaria, unspecified: Secondary | ICD-10-CM | POA: Insufficient documentation

## 2017-07-31 DIAGNOSIS — Z720 Tobacco use: Secondary | ICD-10-CM

## 2017-07-31 MED ORDER — DEXAMETHASONE SODIUM PHOSPHATE 10 MG/ML IJ SOLN
10.0000 mg | Freq: Once | INTRAMUSCULAR | Status: AC
  Administered 2017-07-31: 10 mg via INTRAMUSCULAR
  Filled 2017-07-31: qty 1

## 2017-07-31 NOTE — Discharge Instructions (Signed)
Take your regular allergy medication and zantac daily as directed. Continue your usual home medications. Get plenty of rest and drink plenty of fluids. Avoid any known triggers. STOP VAPING! Please followup with your primary doctor in 5-7 days for recheck of symptoms and for discussion of your diagnoses and further evaluation after today's visit; if you do not have a primary care doctor use the resource guide provided to find one. Return to the ER for changes or worsening symptoms

## 2017-07-31 NOTE — ED Triage Notes (Signed)
Patient here from home with complaints of allergic reaction. Not sure what caused this episode. Reports that she was seen for same last week at a different ER. Given prednisone.

## 2017-07-31 NOTE — ED Provider Notes (Signed)
[facility]-EMERGENCY DEPT Provider Note   CSN: [id number] Arrival date & time: 07/31/17  1156     History   Chief Complaint Chief Complaint  Patient presents with  . Allergic Reaction    HPI Debra Velez is a 27 y.o. female with a PMHx of HTN, asthma, and hemophilia, who presents to the ED with complaints of recurrent allergic reaction type symptoms that began this morning.  Patient states that last week she had similar symptoms where she developed hives from her waist up, went to an ER and was given prednisone 40 mg for 5 days which she finished yesterday.  She initially had hives, she attributed it to her vaping products, so she had stopped using them until last night.  This morning she woke up with hives all over her body, states that they were itchy and red.  She used Allegra 180 mg, Zantac 150 mg, and a cold shower which helped her symptoms.  Heat seems to worsen her symptoms.  She states that the hives have pretty much resolved at this time.  The only thing that she can attribute it to is restarting vaping.  She denies any changes in medications, soaps, detergents, animal or plant contact, or exposure to similar rash.  She denies any tongue or lip swelling, difficulty swallowing or breathing, CP, SOB, abdominal pain, nausea, vomiting, diarrhea, constipation, recent fevers or chills, or any other complaints at this time.  The history is provided by the patient and medical records. No language interpreter was used.    Past Medical History:  Diagnosis Date  . Asthma   . Hemophilia (HCC)   . Hypertension   . Hypoglycemia   . Pneumonia     There are no active problems to display for this patient.   History reviewed. No pertinent surgical history.   OB History   None      Home Medications    Prior to Admission medications   Medication Sig Start Date End Date Taking? Authorizing Provider  clindamycin (CLEOCIN) 300 MG capsule Take 1 capsule (300 mg  total) by mouth 3 (three) times daily. Patient not taking: Reported on 03/25/2017 04/13/13   Schinlever, Santina Evansatherine, PA-C  dicyclomine (BENTYL) 20 MG tablet Take 1 tablet (20 mg total) by mouth 2 (two) times daily. 03/26/17   Kellie ShropshireShrosbree, Emily J, PA-C  etonogestrel (NEXPLANON) 68 MG IMPL implant Inject 1 each into the skin once. 09/15/12   [provider]  ibuprofen (ADVIL,MOTRIN) 200 MG tablet Take 400 mg by mouth every 8 (eight) hours as needed for moderate pain.     [provider]  metoprolol tartrate (LOPRESSOR) 50 MG tablet Take 50 mg by mouth 2 (two) times daily.    [provider]  metroNIDAZOLE (FLAGYL) 500 MG tablet Take 1 tablet (500 mg total) by mouth 2 (two) times daily. 03/26/17   Kellie ShropshireShrosbree, Emily J, PA-C  omeprazole (PRILOSEC OTC) 20 MG tablet Take 20 mg by mouth daily.    [provider]  traMADol (ULTRAM) 50 MG tablet Take 1 tablet (50 mg total) by mouth every 6 (six) hours as needed. Patient not taking: Reported on 03/25/2017 04/13/13   Schinlever, Santina Evansatherine, PA-C    Family History No family history on file.  Social History Social History   Tobacco Use  . Smoking status: Current Some Day Smoker  . Smokeless tobacco: Never Used  Substance Use Topics  . Alcohol use: Yes    Comment: occasionally  . Drug use: No  Allergies   Amoxicillin and Penicillins   Review of Systems Review of Systems  Constitutional: Negative for chills and fever.  HENT: Negative for facial swelling and trouble swallowing.   Respiratory: Negative for shortness of breath.   Cardiovascular: Negative for chest pain.  Gastrointestinal: Negative for abdominal pain, constipation, diarrhea, nausea and vomiting.  Skin: Positive for rash.  Allergic/Immunologic: Negative for immunocompromised state.  Psychiatric/Behavioral: Negative for confusion.   All other systems reviewed and are negative for acute change except as noted in the HPI.    Physical Exam Updated  Vital Signs BP (!) 140/94 (BP Location: Left Arm)   Pulse 99   Temp 98.6 F (37 C) (Oral)   Resp 20   SpO2 100%   Physical Exam  Constitutional: She is oriented to person, place, and time. Vital signs are normal. She appears well-developed and well-nourished.  Non-toxic appearance. No distress.  Afebrile, nontoxic, NAD  HENT:  Head: Normocephalic and atraumatic.  Mouth/Throat: Uvula is midline, oropharynx is clear and moist and mucous membranes are normal. No trismus in the jaw. No uvula swelling. Tonsils are 0 on the right. Tonsils are 0 on the left. No tonsillar exudate.  No oral/facial swelling, no angioedema, handling secretions well.   Eyes: Conjunctivae and EOM are normal. Right eye exhibits no discharge. Left eye exhibits no discharge.  Neck: Normal range of motion. Neck supple.  Cardiovascular: Normal rate, regular rhythm, normal heart sounds and intact distal pulses. Exam reveals no gallop and no friction rub.  No murmur heard. Pulmonary/Chest: Effort normal and breath sounds normal. No respiratory distress. She has no decreased breath sounds. She has no wheezes. She has no rhonchi. She has no rales.  CTAB in all lung fields, no w/r/r, no hypoxia or increased WOB, speaking in full sentences, SpO2 100% on RA   Abdominal: Soft. Normal appearance and bowel sounds are normal. She exhibits no distension. There is no tenderness. There is no rigidity, no rebound, no guarding, no CVA tenderness, no tenderness at McBurney's point and negative Murphy's sign.  Musculoskeletal: Normal range of motion.  Neurological: She is alert and oriented to person, place, and time. She has normal strength. No sensory deficit.  Skin: Skin is warm, dry and intact. No rash noted.  No hives present on exam, a few small faintly erythematous patches to b/l forearms but no persisting urticaria. Excoriations and dry patches to L ankle c/w eczema, pt reports this area is stable and unchanged.   Psychiatric: She has  a normal mood and affect. Her behavior is normal.  Nursing note and vitals reviewed.    ED Treatments / Results  Labs (all labs ordered are listed, but only abnormal results are displayed) Labs Reviewed - No data to display  EKG None  Radiology No results found.  Procedures Procedures (including critical care time)  Medications Ordered in ED Medications  dexamethasone (DECADRON) injection 10 mg (10 mg Intramuscular Given 07/31/17 1418)     Initial Impression / Assessment and Plan / ED Course  I have reviewed the triage vital signs and the nursing notes.  Pertinent labs & imaging results that were available during my care of the patient were reviewed by me and considered in my medical decision making (see chart for details).     27 y.o. female here with allergic reaction. Had one last week, thought it was potentially from vaping product she uses. Stopped vaping until last night when she restarted, and today she woke up with hives all over  her body. Took allegra and zantac which seems to have helped. On exam, no persistent hives, has what appears to be eczema on her L ankle but that's stable per pt. Clear lungs, throat clear. No angioedema. She just finished prednisone course yesterday. Will give decadron so she doesn't need to go back on prednisone, doubt need for prolonged course. Strongly advised vaping cessation. Advised continuation of zantac and allegra. Doubt need for further emergent work up at this time. F/up with PCP in 1wk. I explained the diagnosis and have given explicit precautions to return to the ER including for any other new or worsening symptoms. The patient understands and accepts the medical plan as it's been dictated and I have answered their questions. Discharge instructions concerning home care and prescriptions have been given. The patient is STABLE and is discharged to home in good condition.    Final Clinical Impressions(s) / ED Diagnoses   Final diagnoses:   Hives  Nicotine vapor product user    ED Discharge Orders    [address] [place], [place] 07/31/17 1424    Linwood Dibbles, MD 08/01/17 [phone]

## 2019-03-01 IMAGING — CR DG CHEST 2V
2 series · 2 of 2 positions shown · non-contrast
Comparison: None.

CLINICAL DATA: Cough, fever

EXAM:
CHEST  2 VIEW

[w chest pa]
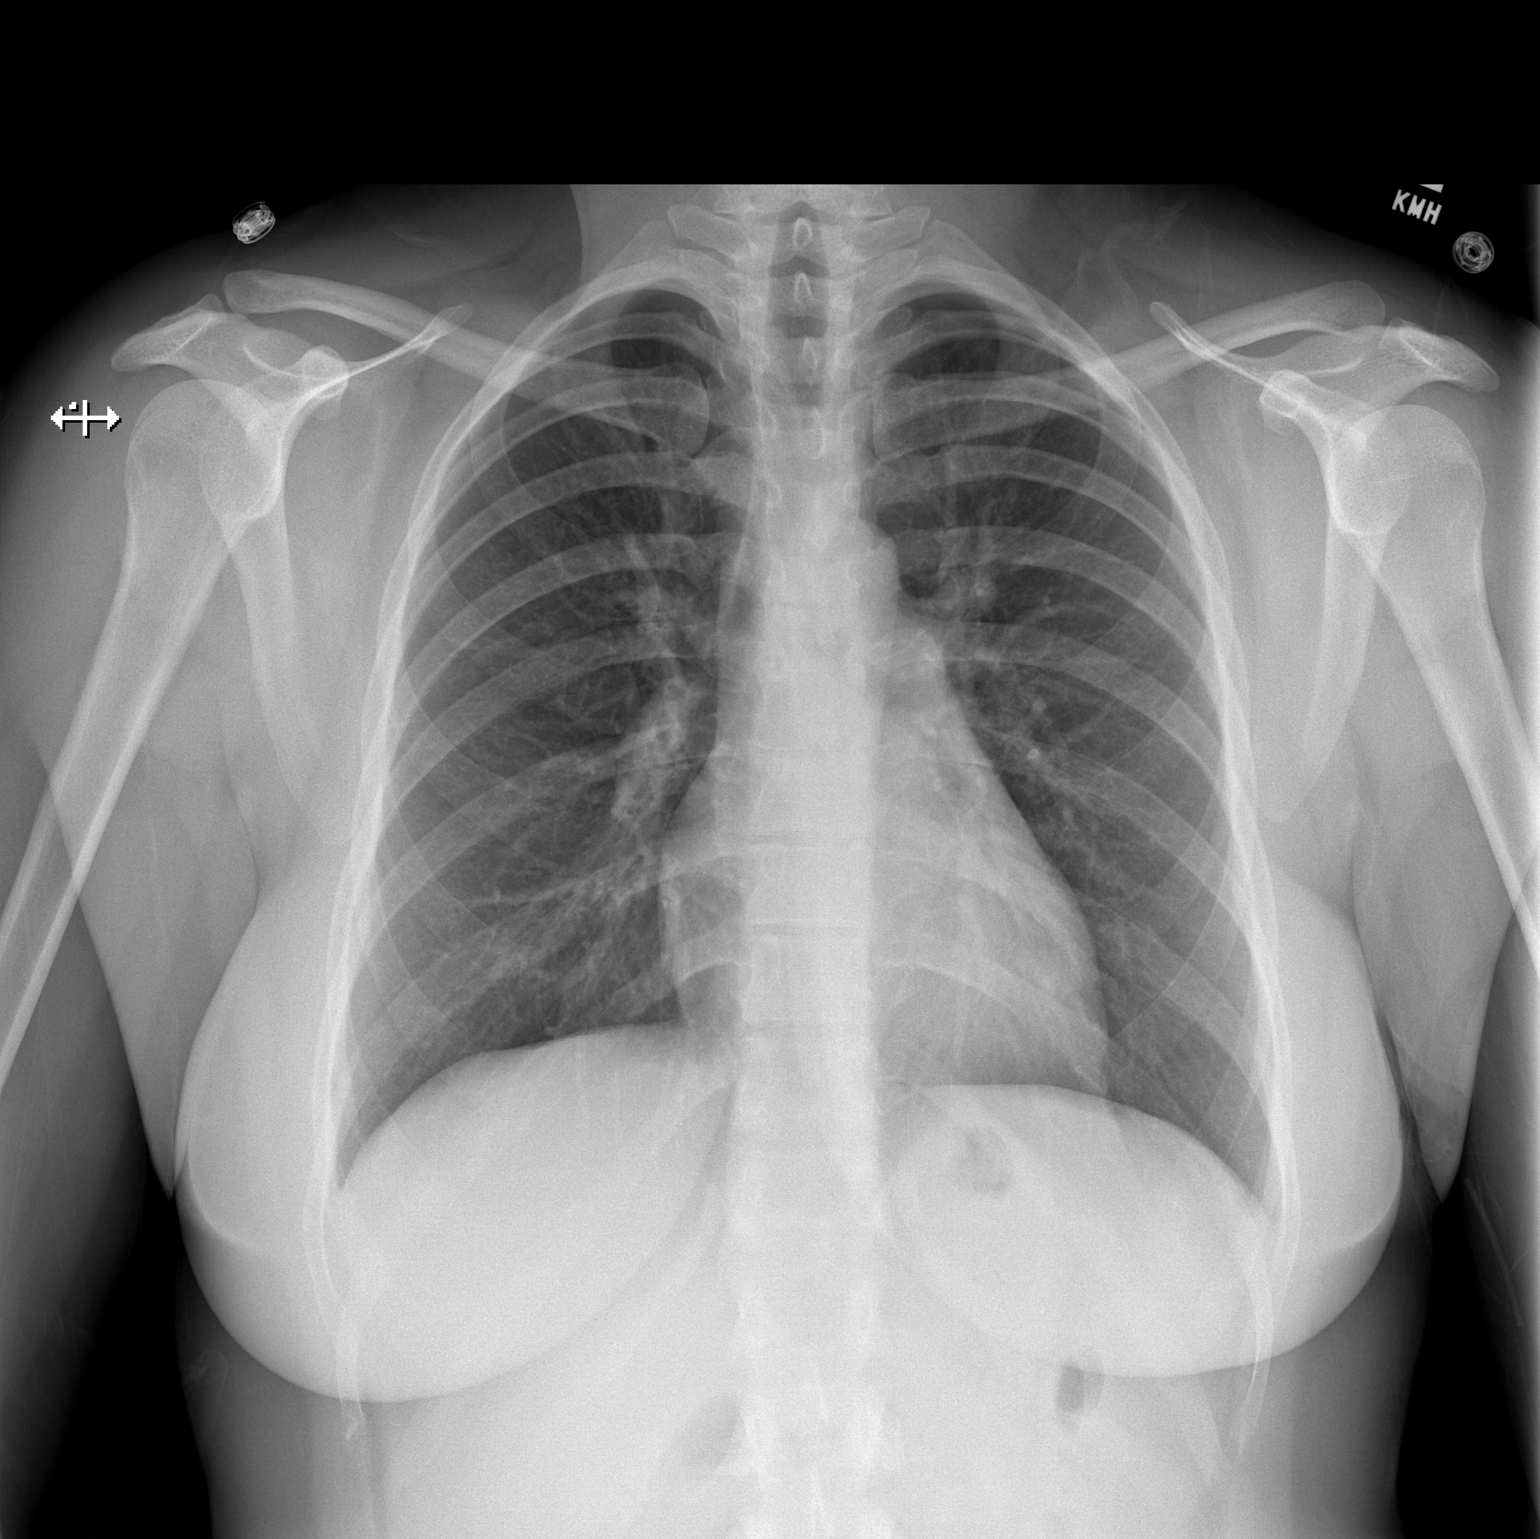

[w chest lat]
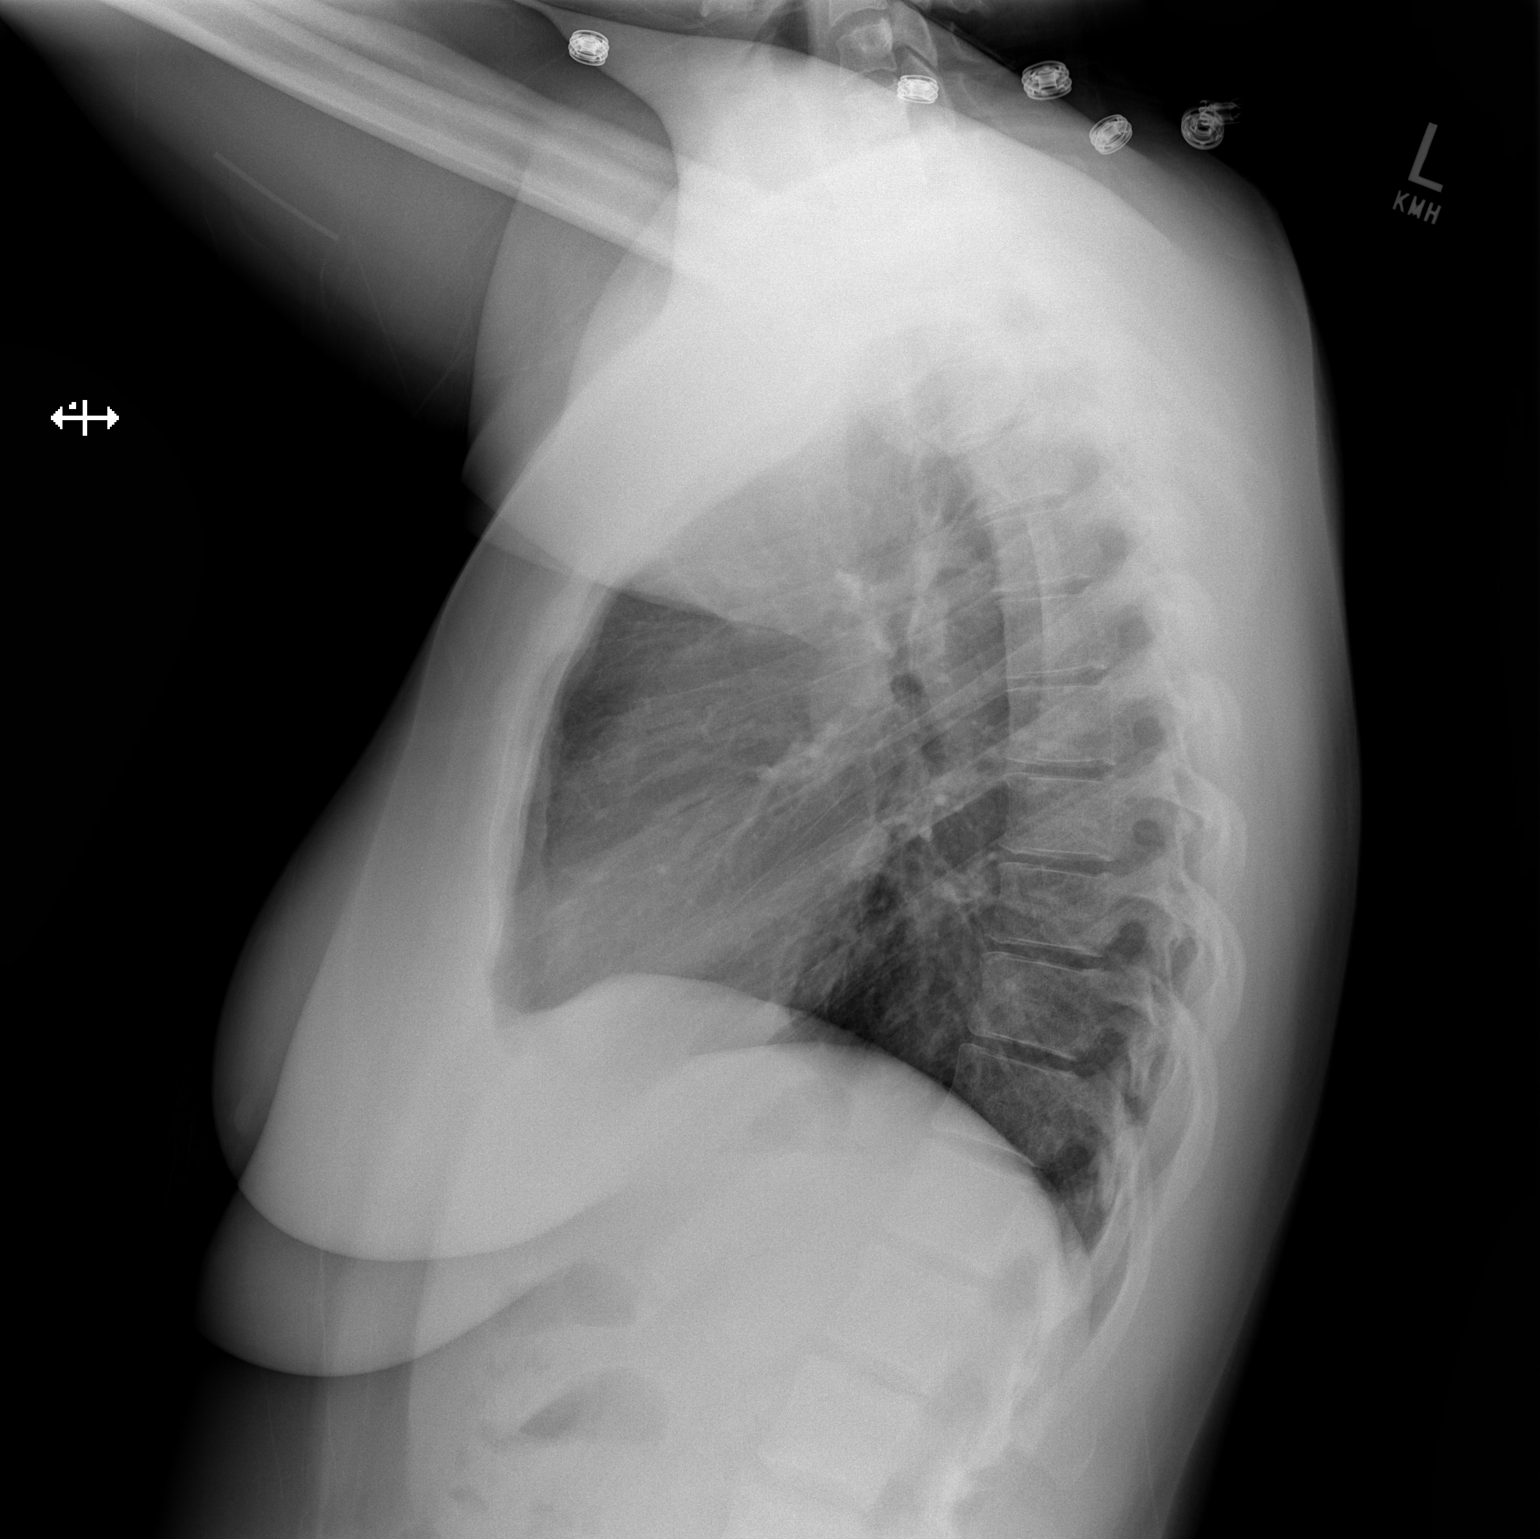

[2 of 2 positions shown; findings below may reference images not displayed]

FINDINGS: Heart and mediastinal contours are within normal limits. No focal
opacities or effusions. No acute bony abnormality.
IMPRESSION: No active cardiopulmonary disease.

## 2021-09-02 ENCOUNTER — Encounter: Payer: Federal, State, Local not specified - PPO | Admitting: Advanced Practice Midwife

## 2021-09-02 NOTE — Progress Notes (Deleted)
   Subjective:     Debra Velez is a 31 y.o. female here at [facility] *** for a routine exam.  Current complaints: ***.  Personal health questionnaire reviewed: {yes/no:9010}.  Do you have a primary care provider? *** Do you feel safe at home? ***    Health Maintenance Due  Topic Date Due   COVID-19 Vaccine (1) Never done   HIV Screening  Never done   Hepatitis C Screening  Never done   PAP SMEAR-Modifier  Never done   TETANUS/TDAP  02/04/2020   INFLUENZA VACCINE  08/27/2021     Risk factors for chronic health problems: Smoking: Alchohol/how much: Pt BMI: There is no height or weight on file to calculate BMI.   Gynecologic History No LMP recorded. (Menstrual status: Irregular Periods). Contraception: {method:5051} Last Pap: ***. Results were: {norm/abn:16337} Last mammogram: ***. Results were: {norm/abn:16337}  Obstetric History OB History  No obstetric history on file.     {Common ambulatory SmartLinks:19316}  Review of Systems {ros; complete:30496}    Objective:   There were no vitals taken for this visit. VS reviewed, nursing note reviewed,  Constitutional: well developed, well nourished, no distress HEENT: normocephalic CV: normal rate Pulm/chest wall: normal effort Breast Exam:  ***Deferred with low risks and shared decision making, discussed recommendation to start mammogram between 40-50 yo/ exam performed: right breast normal without mass, skin or nipple changes or axillary nodes, left breast normal without mass, skin or nipple changes or axillary nodes Abdomen: soft Neuro: alert and oriented x 3 Skin: warm, dry Psych: affect normal Pelvic exam: ***Deferred/ Performed: Cervix pink, visually closed, without lesion, scant white creamy discharge, vaginal walls and external genitalia normal Bimanual exam: Cervix 0/long/high, firm, anterior, neg CMT, uterus nontender, nonenlarged, adnexa without tenderness, enlargement, or mass       Assessment/Plan:    There are no diagnoses linked to this encounter.     No follow-ups on file.   Sharen Counter, CNM 8:17 AM
